# Patient Record
Sex: Female | Born: 1965 | Race: White | Hispanic: No | Smoking: Current every day smoker
Health system: Southern US, Community
[De-identification: ages and names within clinical notes are randomized; demographics above are authoritative.]

---

## 1998-06-17 ENCOUNTER — Emergency Department (HOSPITAL_COMMUNITY): Admission: EM | Admit: 1998-06-17 | Discharge: 1998-06-17 | Payer: Self-pay | Admitting: Emergency Medicine

## 2014-10-10 ENCOUNTER — Other Ambulatory Visit: Payer: Self-pay | Admitting: Family Medicine

## 2014-10-10 DIAGNOSIS — N289 Disorder of kidney and ureter, unspecified: Secondary | ICD-10-CM

## 2014-10-15 ENCOUNTER — Ambulatory Visit
Admission: RE | Admit: 2014-10-15 | Discharge: 2014-10-15 | Disposition: A | Discharge status: Home / Self Care | Payer: Managed Care, Other (non HMO) | Source: Ambulatory Visit | Attending: Family Medicine | Admitting: Family Medicine

## 2014-10-15 DIAGNOSIS — N289 Disorder of kidney and ureter, unspecified: Secondary | ICD-10-CM

## 2015-07-15 DIAGNOSIS — N951 Menopausal and female climacteric states: Secondary | ICD-10-CM | POA: Insufficient documentation

## 2015-07-15 DIAGNOSIS — G4700 Insomnia, unspecified: Secondary | ICD-10-CM | POA: Insufficient documentation

## 2015-07-15 DIAGNOSIS — N281 Cyst of kidney, acquired: Secondary | ICD-10-CM | POA: Insufficient documentation

## 2015-07-15 DIAGNOSIS — I10 Essential (primary) hypertension: Secondary | ICD-10-CM | POA: Insufficient documentation

## 2015-07-15 DIAGNOSIS — R7309 Other abnormal glucose: Secondary | ICD-10-CM | POA: Insufficient documentation

## 2016-10-27 IMAGING — US US RENAL
1 series · 14 of 25 positions shown · non-contrast
Comparison: None.

CLINICAL DATA: Left renal cyst.

EXAM:
RENAL / URINARY TRACT ULTRASOUND COMPLETE

[Series 1: us renal · 0.21mm/px · 14 of 38 slices shown]
[im 1/38]
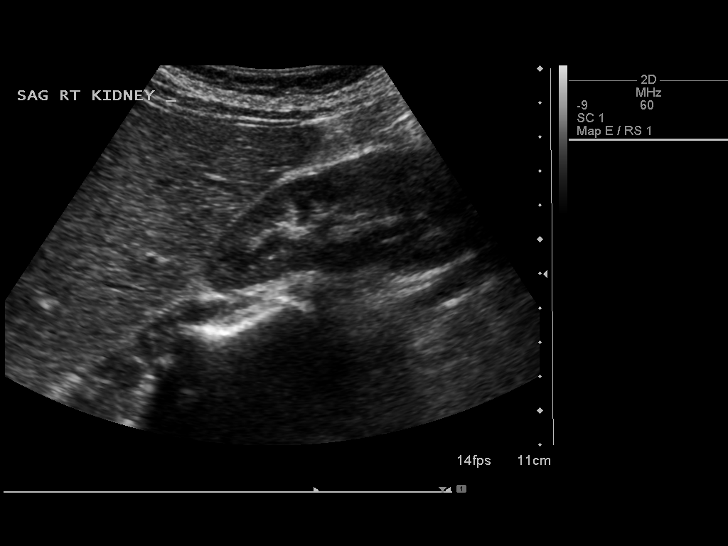
[im 4/38]
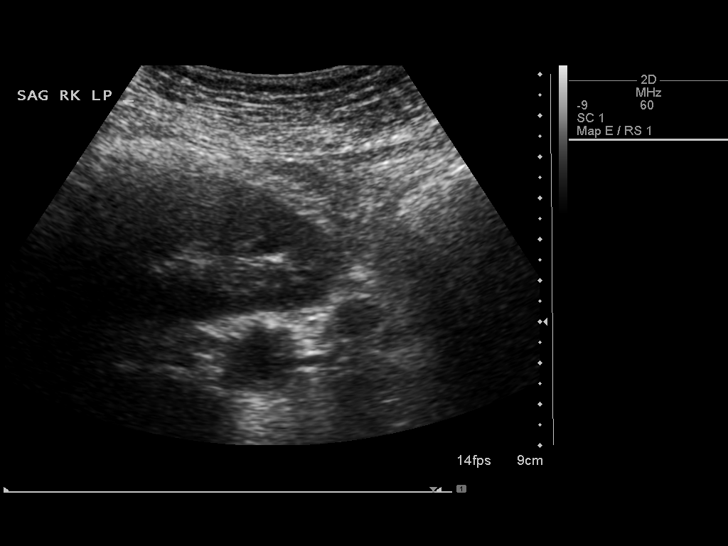
[im 7/38]
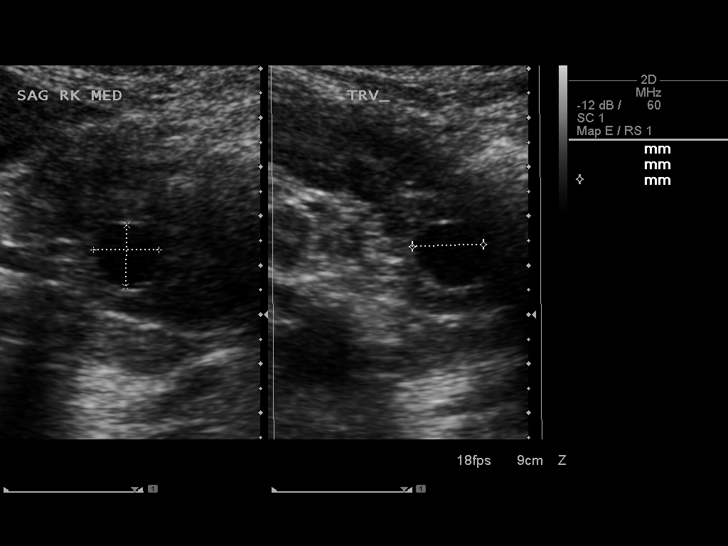
[im 10/38]
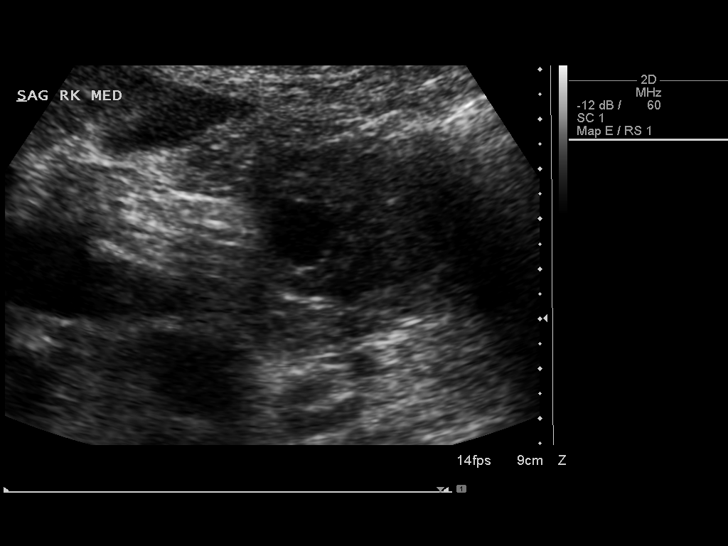
[im 13/38]
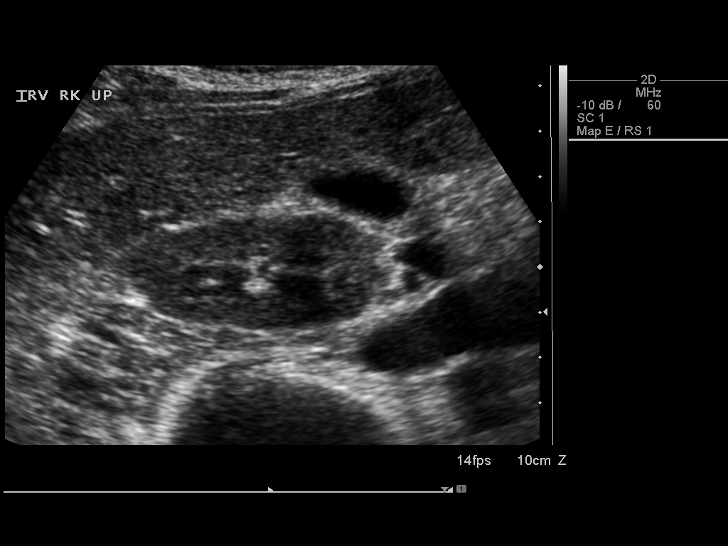
[im 14/38]
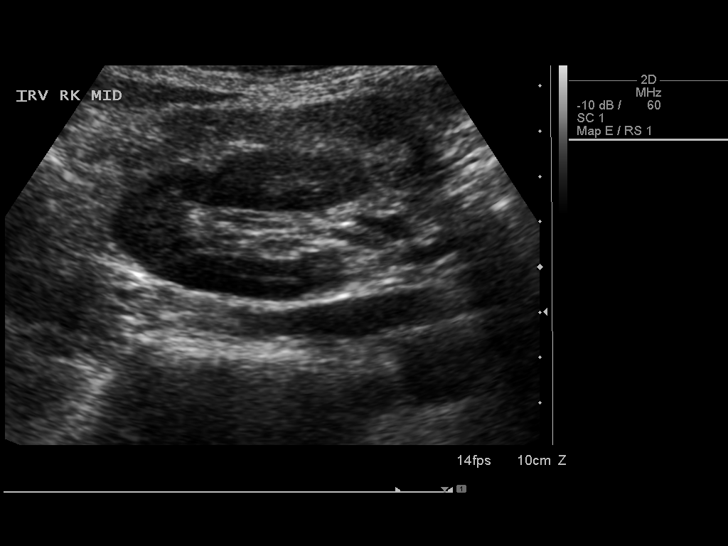
[im 17/38]
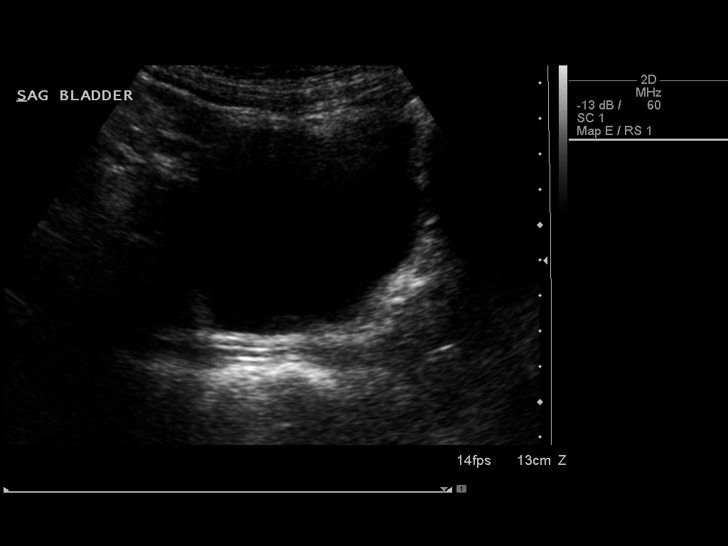
[im 21/38]
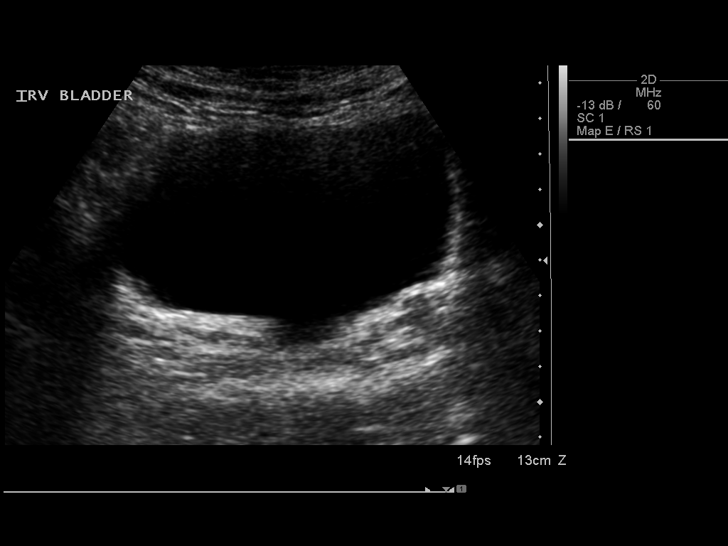
[im 24/38]
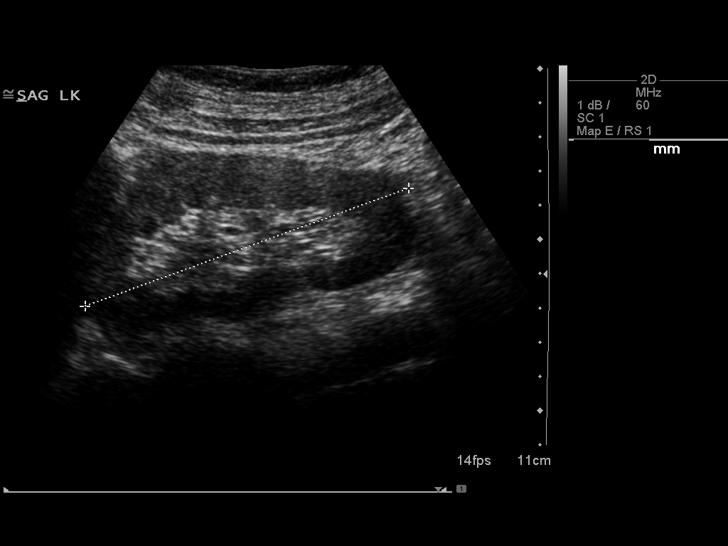
[im 25/38]
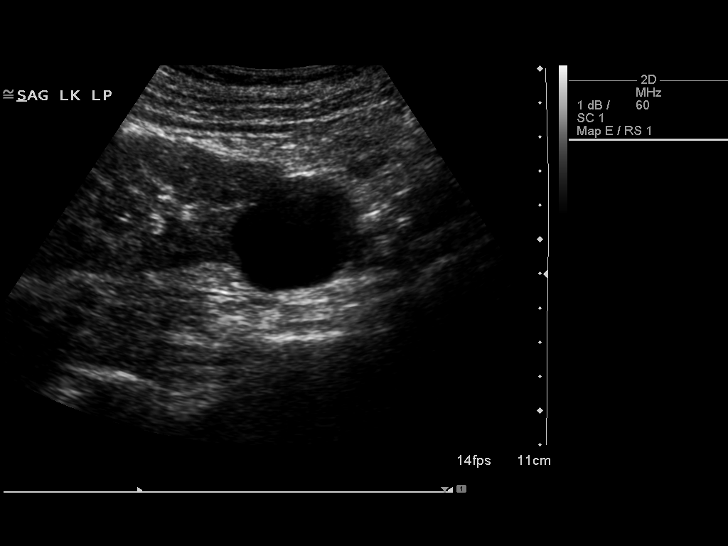
[im 28/38]
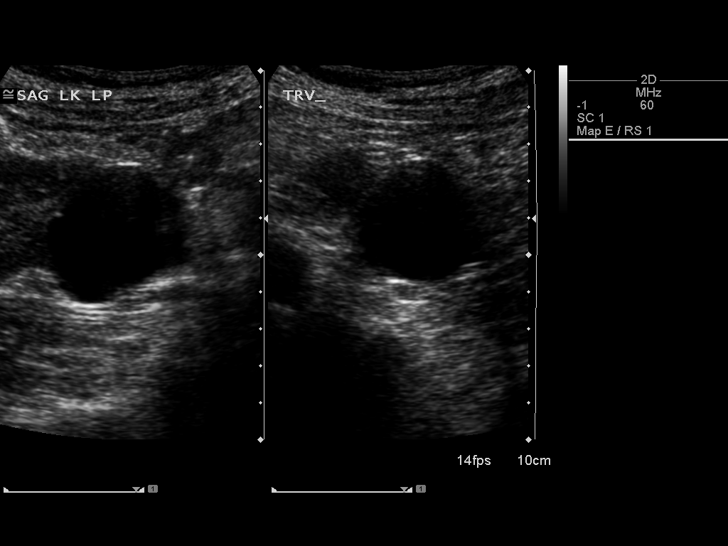
[im 31/38]
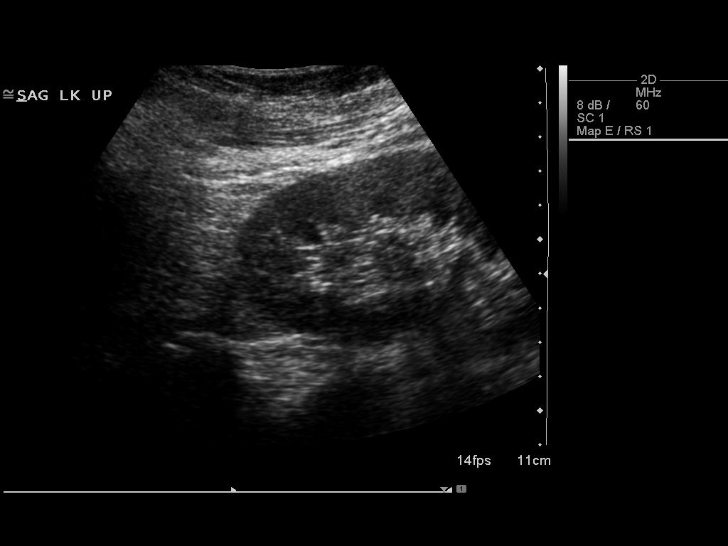
[im 34/38]
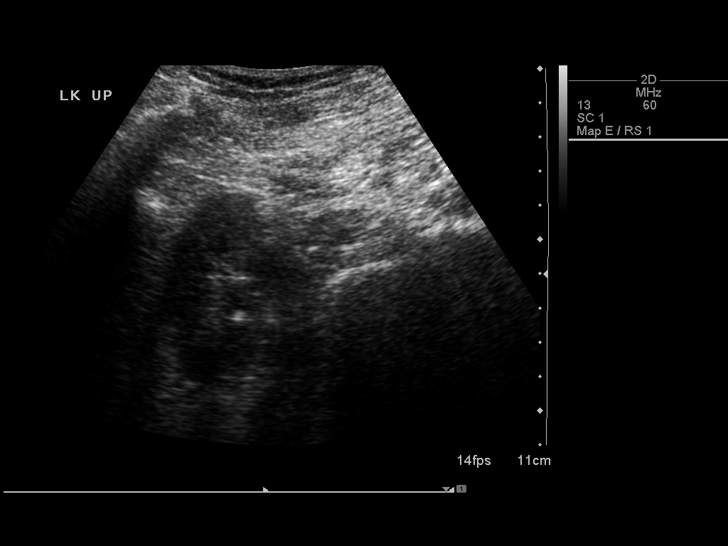
[im 38/38]
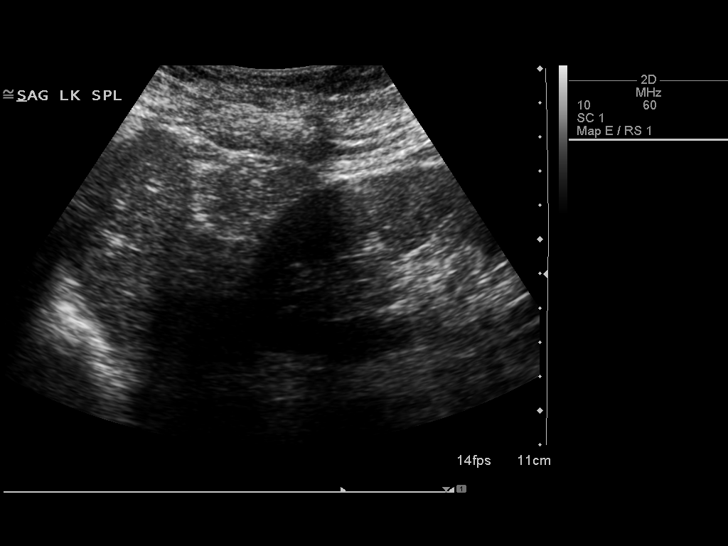

[14 of 25 positions shown; findings below may reference images not displayed]

FINDINGS: Right Kidney:

Length: 10.8 cm. Echogenicity within normal limits. 1.4 cm simple
cyst midportion right kidney . No hydronephrosis visualized.

Left Kidney:

Length: 10.3 cm. Echogenicity within normal limits. 3.7 cm simple
cyst lower pole left kidney. No hydronephrosis visualized.

Bladder:

Appears normal for degree of bladder distention.
IMPRESSION: Bilateral simple renal cysts. No acute or focal abnormality
otherwise identified.

## 2021-01-13 ENCOUNTER — Emergency Department (HOSPITAL_COMMUNITY): Payer: PRIVATE HEALTH INSURANCE

## 2021-01-13 ENCOUNTER — Emergency Department (HOSPITAL_COMMUNITY)
Admission: EM | Admit: 2021-01-13 | Discharge: 2021-01-13 | Disposition: A | Discharge status: Home / Self Care | Payer: PRIVATE HEALTH INSURANCE | Attending: Emergency Medicine | Admitting: Emergency Medicine

## 2021-01-13 ENCOUNTER — Encounter (HOSPITAL_COMMUNITY): Payer: Self-pay | Admitting: *Deleted

## 2021-01-13 DIAGNOSIS — W1839XA Other fall on same level, initial encounter: Secondary | ICD-10-CM | POA: Diagnosis not present

## 2021-01-13 DIAGNOSIS — F17210 Nicotine dependence, cigarettes, uncomplicated: Secondary | ICD-10-CM | POA: Insufficient documentation

## 2021-01-13 DIAGNOSIS — M25572 Pain in left ankle and joints of left foot: Secondary | ICD-10-CM | POA: Insufficient documentation

## 2021-01-13 DIAGNOSIS — S99912A Unspecified injury of left ankle, initial encounter: Secondary | ICD-10-CM | POA: Diagnosis present

## 2021-01-13 DIAGNOSIS — S82832A Other fracture of upper and lower end of left fibula, initial encounter for closed fracture: Secondary | ICD-10-CM | POA: Insufficient documentation

## 2021-01-13 MED ORDER — OXYCODONE-ACETAMINOPHEN 5-325 MG PO TABS: 1.0000 | ORAL_TABLET | Freq: Four times a day (QID) | ORAL | 0 refills | Status: AC | PRN

## 2021-01-13 MED ORDER — OXYCODONE-ACETAMINOPHEN 5-325 MG PO TABS
1.0000 | ORAL_TABLET | Freq: Once | ORAL | Status: AC
  Administered 2021-01-13: 15:00:00 1 via ORAL
  Filled 2021-01-13: qty 1

## 2021-01-13 NOTE — ED Provider Notes (Signed)
[facility] EMERGENCY DEPARTMENT Provider Note   CSN: [id number] Arrival date & time: 01/13/21  1221     History Chief Complaint  Patient presents with   Ankle Pain    Elizabeth Vasquez is a 55 y.o. female.   Ankle Pain Associated symptoms: no back pain, no fever and no neck pain        Elizabeth Vasquez is a 55 y.o. female who presents to the Emergency Department complaining of left ankle injury that occurred this morning.  States that she stepped down off a step and "rolled" her left ankle.  She has been unable to tolerate weightbearing since the fall.  She reports immediate swelling to the lateral aspect of her ankle.  Pain associated with movement.  She has applied ice without significant relief.  She denies numbness or pain of her calf or foot.  No head injury or LOC.  Patient denies any neck pain.   History reviewed. No pertinent past medical history.  There are no problems to display for this patient.   History reviewed. No pertinent surgical history.   OB History   No obstetric history on file.     No family history on file.  Social History   Tobacco Use   Smoking status: Every Day    Types: Cigarettes   Smokeless tobacco: Never  Substance Use Topics   Alcohol use: Yes    Home Medications Prior to Admission medications   Not on File    Allergies    Penicillins  Review of Systems   Review of Systems  Constitutional:  Negative for chills and fever.  Respiratory:  Negative for cough and shortness of breath.   Cardiovascular:  Negative for chest pain.  Gastrointestinal:  Negative for abdominal pain.  Musculoskeletal:  Positive for arthralgias (Left ankle pain and swelling). Negative for back pain, myalgias and neck pain.  Skin:  Negative for rash.  Neurological:  Negative for dizziness, syncope, weakness, numbness and headaches.  Hematological:  Does not bruise/bleed easily.  All other systems reviewed and are negative.  Physical  Exam Updated Vital Signs BP (!) 187/90 (BP Location: Left Arm)    Pulse 83    Temp 98.2 F (36.8 C) (Oral)    Resp 20    Ht 5\' 6"  (1.676 m)    Wt 81.6 kg    SpO2 100%    BMI 29.05 kg/m   Physical Exam Vitals and nursing note reviewed.  Constitutional:      General: She is not in acute distress.    Appearance: Normal appearance. She is not ill-appearing.  Cardiovascular:     Rate and Rhythm: Normal rate and regular rhythm.     Pulses: Normal pulses.  Pulmonary:     Effort: Pulmonary effort is normal.  Musculoskeletal:        General: Swelling, tenderness and signs of injury present.     Comments: Moderate edema noted to the lateral aspect of the left ankle.  There is small amount of ecchymosis noted.  Tender over the deltoid ligament.  No bony deformity.  Foot nontender.  Calf also nontender and compartments are soft.  Skin:    General: Skin is warm.     Capillary Refill: Capillary refill takes less than 2 seconds.     Findings: No rash.  Neurological:     General: No focal deficit present.     Mental Status: She is alert.     Sensory: No sensory deficit.  Motor: No weakness.    ED Results / Procedures / Treatments   Labs (all labs ordered are listed, but only abnormal results are displayed) Labs Reviewed - No data to display  EKG None  Radiology DG Ankle Complete Left  Result Date: 01/13/2021 CLINICAL DATA:  55 year old female with ankle injury EXAM: LEFT ANKLE COMPLETE - 3+ VIEW COMPARISON:  None. FINDINGS: Comminuted acute fracture of the distal fibula, below the mortise, with 3 mm step-off at the fracture fragment. Ankle mortise appears congruent. Soft tissue swelling on the anterior and the lateral ankle. Degenerative changes of the hindfoot and midfoot. IMPRESSION: Comminuted acute distal fibular fracture with associated soft tissue swelling, as above. Electronically Signed   By: Gilmer Mor D.O.   On: 01/13/2021 13:00    Procedures Procedures   Medications  Ordered in ED Medications  oxyCODONE-acetaminophen (PERCOCET/ROXICET) 5-325 MG per tablet 1 tablet (1 tablet Oral Given 01/13/21 1430)    ED Course  I have reviewed the triage vital signs and the nursing notes.  Pertinent labs & imaging results that were available during my care of the patient were reviewed by me and considered in my medical decision making (see chart for details).  SPLINT APPLICATION Date/Time: 15:50 Authorized by: Orris Perin Consent: Verbal consent obtained. Risks and benefits: risks, benefits and alternatives were discussed Consent given by: patient Splint applied by: nursing Location details: left ankle Splint type: stirrup and posterior splint Supplies used: orthoglass and ACE wraps Post-procedure: The splinted body part was neurovascularly unchanged following the procedure. Patient tolerance: Patient tolerated the procedure well with no immediate complications.     MDM Rules/Calculators/A&P                          Patient here for evaluation of mechanical injury that occurred this morning.  Describes inversion injury of the left ankle.  No head injury or LOC.  Reported immediate swelling and pain to the ankle has not been able to bear weight since fall occurred.  On exam, there is tenderness in edema to the lateral aspect of the ankle.  No open wound.  Dorsalis pedis and posterior pulses are intact.  Compartments of the extremity are soft.  X-ray today shows comminuted distal fibula fracture.  Patient agreeable to plan with stirrup and posterior splint she has her own crutches with her.  She is requesting follow-up with local orthopedics.  Appears appropriate for discharge home, agrees to treatment plan with elevation and ice and crutches for weightbearing.  Follow-up information provided for local orthopedics.     Final Clinical Impression(s) / ED Diagnoses Final diagnoses:  Closed fracture of distal end of left fibula, unspecified fracture  morphology, initial encounter    Rx / DC Orders ED Discharge Orders     None        Rosey Bath 01/13/21 1838    Terald Sleeper, MD 01/14/21 [id number]

## 2021-01-13 NOTE — ED Triage Notes (Signed)
Left ankle injury

## 2021-01-13 NOTE — Discharge Instructions (Signed)
Elevate your foot when possible.  Usual crutches for weightbearing.  Call the orthopedic provider listed to arrange follow-up appointment.  Return emergency department for any new worsening symptoms

## 2021-01-13 NOTE — ED Notes (Signed)
X ray in room.

## 2021-01-13 NOTE — ED Notes (Signed)
Pt verbalized understanding of no driving and to use caution within 4 hours of taking pain meds due to meds cause drowsiness 

## 2021-01-15 ENCOUNTER — Encounter: Payer: Self-pay | Admitting: Orthopedic Surgery

## 2021-01-15 ENCOUNTER — Other Ambulatory Visit: Payer: Self-pay

## 2021-01-15 ENCOUNTER — Ambulatory Visit (INDEPENDENT_AMBULATORY_CARE_PROVIDER_SITE_OTHER): Payer: PRIVATE HEALTH INSURANCE | Admitting: Orthopedic Surgery

## 2021-01-15 VITALS — BP 140/89 | HR 78 | Ht 66.0 in | Wt 180.0 lb

## 2021-01-15 DIAGNOSIS — K5900 Constipation, unspecified: Secondary | ICD-10-CM | POA: Insufficient documentation

## 2021-01-15 DIAGNOSIS — S8265XA Nondisplaced fracture of lateral malleolus of left fibula, initial encounter for closed fracture: Secondary | ICD-10-CM

## 2021-01-15 NOTE — Progress Notes (Signed)
Chief Complaint  Patient presents with   Ankle Injury    Left/ 01/13/21     HPI: 55 year old female rolled her ankle complains of pain lateral left ankle.  Initial evaluation in the emergency room x-rays were taken there she has a small fracture Weber a fibula left ankle  History reviewed. No pertinent past medical history.  History reviewed. No pertinent past medical history. History reviewed. No pertinent surgical history.   BP 140/89    Pulse 78    Ht 5\' 6"  (1.676 m)    Wt 180 lb (81.6 kg)    BMI 29.05 kg/m    General appearance: Well-developed well-nourished no gross deformities  Cardiovascular normal pulse and perfusion normal color without edema  Neurologically no sensation loss or deficits or pathologic reflexes  Psychological: Awake alert and oriented x3 mood and affect normal  Skin no lacerations or ulcerations no nodularity no palpable masses, no erythema or nodularity  Musculoskeletal: Left ankle examination.  Tenderness lateral malleolus with swelling she can make the foot go to neutral position in terms of the ankle  Imaging ankle images show a Weber a fracture nondisplaced ankle mortise intact  A/P  [facility] Walker Short Weight-bear as tolerated X-ray left ankle in 4 weeks Out of work 6 weeks

## 2021-01-15 NOTE — Patient Instructions (Signed)
WORK NOTE

## 2021-02-05 DIAGNOSIS — S8265XA Nondisplaced fracture of lateral malleolus of left fibula, initial encounter for closed fracture: Secondary | ICD-10-CM | POA: Insufficient documentation

## 2021-02-12 ENCOUNTER — Ambulatory Visit (INDEPENDENT_AMBULATORY_CARE_PROVIDER_SITE_OTHER): Payer: PRIVATE HEALTH INSURANCE | Admitting: Orthopedic Surgery

## 2021-02-12 ENCOUNTER — Other Ambulatory Visit: Payer: Self-pay

## 2021-02-12 ENCOUNTER — Ambulatory Visit: Payer: PRIVATE HEALTH INSURANCE

## 2021-02-12 DIAGNOSIS — S8265XD Nondisplaced fracture of lateral malleolus of left fibula, subsequent encounter for closed fracture with routine healing: Secondary | ICD-10-CM

## 2021-02-12 NOTE — Progress Notes (Signed)
Fracture care follow-up  Encounter Diagnosis  Name Primary?   Closed nondisplaced fracture of lateral malleolus of left fibula with routine healing, subsequent encounter 01/13/21 Yes    Chief Complaint  Patient presents with   fracture care    DOI 01/13/21 Nondisplaced fracture of lateral malleolus of LT fibula    56 year old female nondisplaced Weber a fracture lateral malleolus doing well.  She is much more comfortable in the boot  X-ray shows the fracture is healing  Recommend x-ray in 3 weeks and then we should be able to remove the boot

## 2021-03-05 ENCOUNTER — Ambulatory Visit: Payer: PRIVATE HEALTH INSURANCE

## 2021-03-05 ENCOUNTER — Ambulatory Visit (INDEPENDENT_AMBULATORY_CARE_PROVIDER_SITE_OTHER): Payer: PRIVATE HEALTH INSURANCE | Admitting: Orthopedic Surgery

## 2021-03-05 ENCOUNTER — Other Ambulatory Visit: Payer: Self-pay

## 2021-03-05 DIAGNOSIS — S8265XD Nondisplaced fracture of lateral malleolus of left fibula, subsequent encounter for closed fracture with routine healing: Secondary | ICD-10-CM

## 2021-03-05 NOTE — Progress Notes (Signed)
Chief Complaint  Patient presents with   fracture care    DOI 01/13/21 Closed nondisplaced fracture of lateral malleolus of left fibula    56 year old female true Weber a fracture of the fibula doing well no pain she is already gone back to work  No tenderness on exam  X-ray shows fracture healing  Patient can resume normal activities in normal shoes

## 2022-03-26 ENCOUNTER — Encounter: Payer: Self-pay | Admitting: Radiology

## 2023-12-20 ENCOUNTER — Emergency Department (HOSPITAL_BASED_OUTPATIENT_CLINIC_OR_DEPARTMENT_OTHER)
Admission: EM | Admit: 2023-12-20 | Discharge: 2023-12-20 | Disposition: A | Discharge status: Home / Self Care | Payer: Self-pay | Attending: Emergency Medicine | Admitting: Emergency Medicine

## 2023-12-20 ENCOUNTER — Other Ambulatory Visit: Payer: Self-pay

## 2023-12-20 ENCOUNTER — Emergency Department (HOSPITAL_BASED_OUTPATIENT_CLINIC_OR_DEPARTMENT_OTHER): Payer: Self-pay | Attending: Emergency Medicine

## 2023-12-20 ENCOUNTER — Encounter (HOSPITAL_BASED_OUTPATIENT_CLINIC_OR_DEPARTMENT_OTHER): Payer: Self-pay

## 2023-12-20 DIAGNOSIS — M25532 Pain in left wrist: Secondary | ICD-10-CM | POA: Insufficient documentation

## 2023-12-20 NOTE — ED Triage Notes (Signed)
 Pt c/o L wrist pain, Friday I was trying to open jar & it popped really loud, now when I use it there's a shooting pain. Advises hx fracture in same wrist, limited ROM to same   Splint from home in place during triage

## 2023-12-20 NOTE — ED Provider Notes (Signed)
 [place] EMERGENCY DEPARTMENT AT [facility] Provider Note   CSN: [id number] Arrival date & time: 12/20/23  1728     Patient presents with: No chief complaint on file.   Elizabeth Vasquez is a 58 y.o. female.   HPI   Patient has a history of prior wrist injury.  She presents to the ED with complaints of left wrist pain.  Patient states she was opening a jar when she felt something pop in her wrist.  Since that time she has had some sharp pain.  Patient denies any falls.  No direct trauma to her wrist.  Patient did start wearing her brace for comfort  Prior to Admission medications   Medication Sig Start Date End Date Taking? Authorizing Provider  oxyCODONE -acetaminophen  (PERCOCET/ROXICET) 5-325 MG tablet Take 1 tablet by mouth every 6 (six) hours as needed for severe pain. 01/13/21   Triplett, Tammy, PA-C    Allergies: Penicillins    Review of Systems  Updated Vital Signs BP 135/72   Pulse 76   Temp 98.4 F (36.9 C) (Oral)   Resp 16   SpO2 97%   Physical Exam Vitals and nursing note reviewed.  Constitutional:      General: She is not in acute distress.    Appearance: She is well-developed.  HENT:     Head: Normocephalic and atraumatic.     Right Ear: External ear normal.     Left Ear: External ear normal.  Eyes:     General: No scleral icterus.       Right eye: No discharge.        Left eye: No discharge.     Conjunctiva/sclera: Conjunctivae normal.  Neck:     Trachea: No tracheal deviation.  Cardiovascular:     Rate and Rhythm: Normal rate.  Pulmonary:     Effort: Pulmonary effort is normal. No respiratory distress.     Breath sounds: No stridor.  Abdominal:     General: There is no distension.  Musculoskeletal:        General: Tenderness present. No swelling or deformity.     Cervical back: Neck supple.     Comments: Tenderness palpation distal radius snuffbox area of the wrist, no deformity no swelling  Skin:    General: Skin is warm and  dry.     Findings: No rash.  Neurological:     Mental Status: She is alert. Mental status is at baseline.     Cranial Nerves: No dysarthria or facial asymmetry.     Motor: No seizure activity.     (all labs ordered are listed, but only abnormal results are displayed) Labs Reviewed - No data to display  EKG: None  Radiology: DG Wrist Complete Left Result Date: 12/20/2023 EXAM: 3 OR MORE VIEW(S) XRAY OF THE LEFT WRIST 12/20/2023 06:08:00 PM COMPARISON: None available. CLINICAL HISTORY: injury FINDINGS: BONES AND JOINTS: No acute fracture. Degenerative subcortical cysts within the scaphoid. No joint dislocation. SOFT TISSUES: The soft tissues are unremarkable. IMPRESSION: 1. No acute fracture or dislocation. 2. Degenerative subcortical cysts within the scaphoid. Electronically signed by: Oneil Devonshire MD 12/20/2023 07:02 PM EST RP Workstation: [id number]     Procedures   Medications Ordered in the ED - No data to display  Clinical Course as of 12/20/23 2020  Mon Dec 20, 2023  1940 Wrist x-ray shows no acute fracture or dislocation, degenerative changes within the scaphoid [JK]    Clinical Course User Index [JK] Randol Simmonds, MD  Medical Decision Making Amount and/or Complexity of Data Reviewed Radiology: ordered.   Patient's x-rays do not show any signs of fracture.  She does have history of prior wrist injury.  Will have patient continue to wear the splint.  She can consider following up with an orthopedic doctor as needed as an outpatient     Final diagnoses:  Left wrist pain    ED Discharge Orders     None          Randol Simmonds, MD 12/20/23 2020

## 2023-12-20 NOTE — Discharge Instructions (Addendum)
 Take over-the-counter medications as needed for pain.  Wear your splint for comfort.  Consider seeing orthopedic doctor to be rechecked.
